# Patient Record
Sex: Male | Born: 2000 | Race: Black or African American | Hispanic: No | Marital: Single | Smoking: Never smoker
Health system: Southern US, Community
[De-identification: ages and names within clinical notes are randomized; demographics above are authoritative.]

## PROBLEM LIST (undated history)

## (undated) DIAGNOSIS — G4733 Obstructive sleep apnea (adult) (pediatric): Secondary | ICD-10-CM

## (undated) DIAGNOSIS — I10 Essential (primary) hypertension: Secondary | ICD-10-CM

## (undated) DIAGNOSIS — E8889 Other specified metabolic disorders: Secondary | ICD-10-CM

## (undated) DIAGNOSIS — D571 Sickle-cell disease without crisis: Secondary | ICD-10-CM

## (undated) DIAGNOSIS — J4520 Mild intermittent asthma, uncomplicated: Secondary | ICD-10-CM

## (undated) DIAGNOSIS — D5744 Sickle-cell thalassemia beta plus without crisis: Secondary | ICD-10-CM

## (undated) DIAGNOSIS — G43009 Migraine without aura, not intractable, without status migrainosus: Secondary | ICD-10-CM

## (undated) DIAGNOSIS — D75A Glucose-6-phosphate dehydrogenase (G6PD) deficiency without anemia: Secondary | ICD-10-CM

## (undated) DIAGNOSIS — I429 Cardiomyopathy, unspecified: Secondary | ICD-10-CM

## (undated) DIAGNOSIS — F411 Generalized anxiety disorder: Secondary | ICD-10-CM

## (undated) DIAGNOSIS — F909 Attention-deficit hyperactivity disorder, unspecified type: Secondary | ICD-10-CM

## (undated) HISTORY — DX: Obstructive sleep apnea (adult) (pediatric): G47.33

## (undated) HISTORY — DX: Essential (primary) hypertension: I10

## (undated) HISTORY — DX: Mild intermittent asthma, uncomplicated: J45.20

## (undated) HISTORY — DX: Generalized anxiety disorder: F41.1

## (undated) HISTORY — DX: Cardiomyopathy, unspecified: I42.9

## (undated) HISTORY — PX: WISDOM TOOTH EXTRACTION: SHX21

## (undated) HISTORY — DX: Migraine without aura, not intractable, without status migrainosus: G43.009

## (undated) HISTORY — DX: Attention-deficit hyperactivity disorder, unspecified type: F90.9

---
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
## 2020-03-20 ENCOUNTER — Encounter: Payer: Self-pay | Admitting: Family Medicine

## 2020-03-20 NOTE — Progress Notes (Unsigned)
Billy Rodgers is an 19 year old male with a medical history significant for sickle cell disease called with complaints of generalized pain that is consistent with his previous sickle cell pain crisis.  He was referred to clinic by Virgina Organ, [facility]

## 2020-03-21 ENCOUNTER — Non-Acute Institutional Stay (HOSPITAL_COMMUNITY): Payer: BLUE CROSS/BLUE SHIELD

## 2020-03-21 ENCOUNTER — Non-Acute Institutional Stay (HOSPITAL_COMMUNITY)
Admission: AD | Admit: 2020-03-21 | Discharge: 2020-03-21 | Disposition: A | Discharge status: Home / Self Care | Payer: BLUE CROSS/BLUE SHIELD | Source: Ambulatory Visit | Attending: Internal Medicine | Admitting: Internal Medicine

## 2020-03-21 DIAGNOSIS — E669 Obesity, unspecified: Secondary | ICD-10-CM | POA: Insufficient documentation

## 2020-03-21 DIAGNOSIS — D57459 Sickle-cell thalassemia beta plus with crisis, unspecified: Secondary | ICD-10-CM

## 2020-03-21 DIAGNOSIS — D5744 Sickle-cell thalassemia beta plus without crisis: Secondary | ICD-10-CM | POA: Diagnosis present

## 2020-03-21 DIAGNOSIS — D5700 Hb-SS disease with crisis, unspecified: Secondary | ICD-10-CM | POA: Diagnosis present

## 2020-03-21 DIAGNOSIS — Z885 Allergy status to narcotic agent status: Secondary | ICD-10-CM | POA: Insufficient documentation

## 2020-03-21 LAB — CBC WITH DIFFERENTIAL/PLATELET
Abs Immature Granulocytes: 0.04 10*3/uL (ref 0.00–0.07)
Basophils Absolute: 0 10*3/uL (ref 0.0–0.1)
Basophils Relative: 1 %
Eosinophils Absolute: 0.1 10*3/uL (ref 0.0–0.5)
Eosinophils Relative: 1 %
HCT: 37.1 % — ABNORMAL LOW (ref 39.0–52.0)
Hemoglobin: 11.6 g/dL — ABNORMAL LOW (ref 13.0–17.0)
Immature Granulocytes: 1 %
Lymphocytes Relative: 33 %
Lymphs Abs: 1.7 10*3/uL (ref 0.7–4.0)
MCH: 26.3 pg (ref 26.0–34.0)
MCHC: 31.3 g/dL (ref 30.0–36.0)
MCV: 84.1 fL (ref 80.0–100.0)
Monocytes Absolute: 0.6 10*3/uL (ref 0.1–1.0)
Monocytes Relative: 12 %
Neutro Abs: 2.7 10*3/uL (ref 1.7–7.7)
Neutrophils Relative %: 52 %
Platelets: 223 10*3/uL (ref 150–400)
RBC: 4.41 MIL/uL (ref 4.22–5.81)
RDW: 18.1 % — ABNORMAL HIGH (ref 11.5–15.5)
WBC: 5.1 10*3/uL (ref 4.0–10.5)
nRBC: 0 % (ref 0.0–0.2)

## 2020-03-21 LAB — COMPREHENSIVE METABOLIC PANEL
ALT: 23 U/L (ref 0–44)
AST: 28 U/L (ref 15–41)
Albumin: 4.4 g/dL (ref 3.5–5.0)
Alkaline Phosphatase: 72 U/L (ref 38–126)
Anion gap: 9 (ref 5–15)
BUN: 11 mg/dL (ref 6–20)
CO2: 24 mmol/L (ref 22–32)
Calcium: 8.6 mg/dL — ABNORMAL LOW (ref 8.9–10.3)
Chloride: 108 mmol/L (ref 98–111)
Creatinine, Ser: 0.89 mg/dL (ref 0.61–1.24)
GFR calc Af Amer: >60 mL/min (ref >60)
GFR calc non Af Amer: >60 mL/min (ref >60)
Glucose, Bld: 98 mg/dL (ref 70–99)
Potassium: 3.8 mmol/L (ref 3.5–5.1)
Sodium: 141 mmol/L (ref 135–145)
Total Bilirubin: 2.1 mg/dL — ABNORMAL HIGH (ref 0.3–1.2)
Total Protein: 6.6 g/dL (ref 6.5–8.1)

## 2020-03-21 LAB — RETICULOCYTES
Immature Retic Fract: 35.5 % — ABNORMAL HIGH (ref 2.3–15.9)
RBC.: 4.42 MIL/uL (ref 4.22–5.81)
Retic Count, Absolute: 178.1 10*3/uL (ref 19.0–186.0)
Retic Ct Pct: 4 % — ABNORMAL HIGH (ref 0.4–3.1)

## 2020-03-21 LAB — URINALYSIS, ROUTINE W REFLEX MICROSCOPIC
Bilirubin Urine: NEGATIVE
Glucose, UA: NEGATIVE mg/dL
Hgb urine dipstick: NEGATIVE
Ketones, ur: NEGATIVE mg/dL
Leukocytes,Ua: NEGATIVE
Nitrite: NEGATIVE
Protein, ur: NEGATIVE mg/dL
Specific Gravity, Urine: 1.012 (ref 1.005–1.030)
pH: 6 (ref 5.0–8.0)

## 2020-03-21 LAB — RAPID URINE DRUG SCREEN, HOSP PERFORMED
Amphetamines: NOT DETECTED
Barbiturates: NOT DETECTED
Benzodiazepines: NOT DETECTED
Cocaine: NOT DETECTED
Opiates: POSITIVE — AB
Tetrahydrocannabinol: NOT DETECTED

## 2020-03-21 LAB — LACTATE DEHYDROGENASE: LDH: 183 U/L (ref 98–192)

## 2020-03-21 MED ORDER — HYDROMORPHONE HCL 1 MG/ML IJ SOLN
1.0000 mg | Freq: Once | INTRAMUSCULAR | Status: AC
  Administered 2020-03-21: 1 mg via INTRAVENOUS
  Filled 2020-03-21: qty 1

## 2020-03-21 MED ORDER — ACETAMINOPHEN 500 MG PO TABS
1000.0000 mg | ORAL_TABLET | Freq: Once | ORAL | Status: AC
  Administered 2020-03-21: 1000 mg via ORAL
  Filled 2020-03-21: qty 2

## 2020-03-21 MED ORDER — SODIUM CHLORIDE 0.45 % IV SOLN: INTRAVENOUS | Status: DC

## 2020-03-21 MED ORDER — DIPHENHYDRAMINE HCL 25 MG PO CAPS: 25.0000 mg | ORAL_CAPSULE | ORAL | Status: DC | PRN

## 2020-03-21 MED ORDER — ONDANSETRON HCL 4 MG/2ML IJ SOLN: 4.0000 mg | Freq: Four times a day (QID) | INTRAMUSCULAR | Status: DC | PRN

## 2020-03-21 MED ORDER — KETOROLAC TROMETHAMINE 30 MG/ML IJ SOLN
15.0000 mg | Freq: Once | INTRAMUSCULAR | Status: AC
  Administered 2020-03-21: 15 mg via INTRAVENOUS
  Filled 2020-03-21: qty 1

## 2020-03-21 MED ORDER — OXYCODONE HCL 5 MG PO TABS
5.0000 mg | ORAL_TABLET | Freq: Once | ORAL | Status: AC
  Administered 2020-03-21: 5 mg via ORAL
  Filled 2020-03-21: qty 1

## 2020-03-21 NOTE — Discharge Summary (Signed)
[facility] Discharge Summary   Patient ID: Billy Rodgers MRN: [id number] DOB/AGE: 2001-01-08 18 y.o.  Admit date: 03/21/2020 Discharge date: 03/21/2020  Primary Care Physician:  Patient, No Pcp Per  Admission Diagnoses:  Principal Problem:   Sickle cell disease, type S beta-plus thalassemia with crisis Active Problems:   Sickle cell pain crisis [facility])   Discharge Medications:  Allergies as of 03/21/2020      Reactions   Demerol [meperidine] Other (See Comments)   seizure      Medication List    You have not been prescribed any medications.      Consults:  None  Significant Diagnostic Studies:  No results found.  History of present illness: Billy Rodgers is an 19 year old male with a medical history significant for sickle cell disease, type S beta plus thalassemia presents with complaints of left upper extremity and right lower extremity pain that has been unrelieved by home pain medications.  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  Patient was in his usual state of health prior to 3 days ago when pain intensity suddenly increased.  He has not identified any provocative factors concerning current crisis.  His primary care provider is in [place] and he recently established care with hematologist at [facility] system.  Patient has been taking hydroxyurea and recently started monthly Crizanlizamab.  He typically has sickle cell crisis around every 2 to 3 months.  His current pain intensity is 7/10 primarily to right lower extremity and left upper extremity.  He last had hydrocodone this a.m. without sustained relief.  He denies any headache, chest pain, urinary symptoms, persistent cough, fever, chills, nausea, vomiting, or diarrhea.  He has had no sick contacts, recent travel, or exposure to COVID-19. [facility] Course:  Patient admitted to sickle cell day infusion clinic for management of pain  crisis. Reviewed all laboratory values.  Hemoglobin 11.6, appears to be consistent with patient's baseline.  Total bilirubin is slightly elevated at 2.1.  LDH is within normal limits.  Pain managed with IV Dilaudid 1 mg x 1 IV fluids, 0.45% saline at 150 mL/h Toradol 15 mg IV x1 Tylenol 1000 mg x 1 Oxycodone 5 mg x 1 Pain intensity decreased to 2/10.  Patient does not warrant inpatient admission on today. He is alert, oriented, and ambulating without assistance.  Discussed the importance of establishing care with a PCP.  Also, follow-up with hematologist as scheduled.  Patient expressed understanding. Patient will discharge home in a hemodynamically stable condition.  Discharge instructions: Resume all home medications.   Follow up with PCP as previously  scheduled.   Discussed the importance of drinking 64 ounces of water daily, dehydration of red blood cells may lead further sickling.   Avoid all stressors that precipitate sickle cell pain crisis.     The patient was given clear instructions to go to ER or return to medical center if symptoms do not improve, worsen or new problems develop.    Physical Exam at Discharge:  BP (!) 151/73 (BP Location: Left Arm)   Pulse 70   Temp 98.5 F (36.9 C) (Oral)   Resp 16   SpO2 100%   Physical Exam Constitutional:      Appearance: Normal appearance. He is obese.  HENT:     Head: Normocephalic.     Mouth/Throat:     Mouth: Mucous membranes are moist.  Eyes:     Pupils: Pupils are  equal, round, and reactive to light.  Cardiovascular:     Rate and Rhythm: Normal rate and regular rhythm.     Pulses: Normal pulses.  Pulmonary:     Effort: Pulmonary effort is normal.  Abdominal:     General: Abdomen is flat. Bowel sounds are normal.  Musculoskeletal:        General: Normal range of motion.  Skin:    General: Skin is warm.  Neurological:     General: No focal deficit present.     Mental Status: He is alert. Mental status is at  baseline.  Psychiatric:        Mood and Affect: Mood normal.        Behavior: Behavior normal.        Thought Content: Thought content normal.        Judgment: Judgment normal.      Disposition at Discharge:   Discharge Orders:   Condition at Discharge:   Stable  Time spent on Discharge:  Greater than 30 minutes.  Signed; Nolon Nations  APRN, MSN, FNP-C Patient Care [facility] [address] [place], [place] [address] [phone]  03/21/2020, 12:08 PM

## 2020-03-21 NOTE — Progress Notes (Signed)
Patient admitted to the day hospital for treatment of sickle cell pain crisis. Patient reported pain rated 7/10 in the left leg and 3/10 in the right arm. Patient given IV Dilaudid, PO Oxycodone, IV Toradol, PO Tylenol and hydrated with IV fluids. At discharge patient reported  pain at 2/10 in the leg and 0/10 in the arm. Discharge instructions given. Patient alert, oriented and ambulatory at discharge.

## 2020-03-21 NOTE — H&P (Signed)
[facility] History and Physical   Date: 03/21/2020  Patient name: Billy Rodgers Medical record number: [id number] Date of birth: [birth year] Age: 19 y.o. Gender: male PCP: Patient, No Pcp Per  Attending physician: Quentin Angst, MD  Chief Complaint: Sickle cell pain   History of Present Illness: Kian Ottaviano is an 19 year old male with a medical history significant for sickle cell disease, type S beta plus thalassemia presents with complaints of left upper extremity and right lower extremity pain that has been unrelieved by home pain medications.  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  Patient was in his usual state of health prior to 3 days ago when pain intensity suddenly increased.  He has not identified any provocative factors concerning current crisis.  His primary care provider is in [place] and he recently established care with hematologist at [facility] system.  Patient has been taking hydroxyurea and recently started monthly Crizanlizamab.  He typically has sickle cell crisis around every 2 to 3 months.  His current pain intensity is 7/10 primarily to right lower extremity and left upper extremity.  He last had hydrocodone this a.m. without sustained relief.  He denies any headache, chest pain, urinary symptoms, persistent cough, fever, chills, nausea, vomiting, or diarrhea.  He has had no sick contacts, recent travel, or exposure to COVID-19.  Meds: No medications prior to admission.    Allergies: Demerol [meperidine] Past Medical History:  Diagnosis Date  . CYP2C19 intermediate metabolizer (HCC)   . G6PD deficiency   . Sickle cell anemia (HCC)   . Sickle cell disease, type S beta-plus thalassemia    No past surgical history on file. No family history on file. Social History   Socioeconomic History  . Marital status: Single    Spouse name: Not on file  . Number of children: Not on file  . Years  of education: Not on file  . Highest education level: Not on file  Occupational History  . Not on file  Tobacco Use  . Smoking status: Never Smoker  . Smokeless tobacco: Never Used  Substance and Sexual Activity  . Alcohol use: Not on file  . Drug use: Not on file  . Sexual activity: Not on file  Other Topics Concern  . Not on file  Social History Narrative  . Not on file   Social Determinants of Health   Financial Resource Strain:   . Difficulty of Paying Living Expenses: Not on file  Food Insecurity:   . Worried About Programme researcher, broadcasting/film/video in the Last Year: Not on file  . Ran Out of Food in the Last Year: Not on file  Transportation Needs:   . Lack of Transportation (Medical): Not on file  . Lack of Transportation (Non-Medical): Not on file  Physical Activity:   . Days of Exercise per Week: Not on file  . Minutes of Exercise per Session: Not on file  Stress:   . Feeling of Stress : Not on file  Social Connections:   . Frequency of Communication with Friends and Family: Not on file  . Frequency of Social Gatherings with Friends and Family: Not on file  . Attends Religious Services: Not on file  . Active Member of Clubs or Organizations: Not on file  . Attends Banker Meetings: Not on file  . Marital Status: Not on file  Intimate Partner Violence:   . Fear of Current or Ex-Partner: Not  on file  . Emotionally Abused: Not on file  . Physically Abused: Not on file  . Sexually Abused: Not on file   Review of Systems  Constitutional: Negative for chills and fever.  HENT: Negative.   Eyes: Negative.   Respiratory: Negative.   Cardiovascular: Negative.   Gastrointestinal: Negative.   Genitourinary: Negative.   Musculoskeletal: Positive for joint pain. Negative for back pain.  Skin: Negative.   Neurological: Negative.   Psychiatric/Behavioral: Negative.      Physical Exam: Blood pressure (!) 151/73, pulse 70, temperature 98.5 F (36.9 C), temperature  source Oral, resp. rate 16, SpO2 100 %. Physical Exam Constitutional:      Appearance: Normal appearance. He is obese.  Eyes:     Pupils: Pupils are equal, round, and reactive to light.  Cardiovascular:     Rate and Rhythm: Normal rate and regular rhythm.     Pulses: Normal pulses.  Pulmonary:     Effort: Pulmonary effort is normal.  Abdominal:     General: Abdomen is flat. Bowel sounds are normal.  Musculoskeletal:        General: Normal range of motion.  Skin:    General: Skin is warm.  Neurological:     General: No focal deficit present.     Mental Status: He is alert. Mental status is at baseline.  Psychiatric:        Mood and Affect: Mood normal.        Thought Content: Thought content normal.        Judgment: Judgment normal.      Lab results: Results for orders placed or performed during the hospital encounter of 03/21/20 (from the past 24 hour(s))  CBC with Differential/Platelet     Status: Abnormal   Collection Time: 03/21/20  9:10 AM  Result Value Ref Range   WBC 5.1 4.0 - 10.5 K/uL   RBC 4.41 4.22 - 5.81 MIL/uL   Hemoglobin 11.6 (L) 13.0 - 17.0 g/dL   HCT 40.9 (L) 39 - 52 %   MCV 84.1 80.0 - 100.0 fL   MCH 26.3 26.0 - 34.0 pg   MCHC 31.3 30.0 - 36.0 g/dL   RDW 81.1 (H) 91.4 - 78.2 %   Platelets 223 150 - 400 K/uL   nRBC 0.0 0.0 - 0.2 %   Neutrophils Relative % 52 %   Neutro Abs 2.7 1.7 - 7.7 K/uL   Lymphocytes Relative 33 %   Lymphs Abs 1.7 0.7 - 4.0 K/uL   Monocytes Relative 12 %   Monocytes Absolute 0.6 0 - 1 K/uL   Eosinophils Relative 1 %   Eosinophils Absolute 0.1 0 - 0 K/uL   Basophils Relative 1 %   Basophils Absolute 0.0 0 - 0 K/uL   Immature Granulocytes 1 %   Abs Immature Granulocytes 0.04 0.00 - 0.07 K/uL  Comprehensive metabolic panel     Status: Abnormal   Collection Time: 03/21/20  9:10 AM  Result Value Ref Range   Sodium 141 135 - 145 mmol/L   Potassium 3.8 3.5 - 5.1 mmol/L   Chloride 108 98 - 111 mmol/L   CO2 24 22 - 32 mmol/L    Glucose, Bld 98 70 - 99 mg/dL   BUN 11 6 - 20 mg/dL   Creatinine, Ser 9.56 0.61 - 1.24 mg/dL   Calcium 8.6 (L) 8.9 - 10.3 mg/dL   Total Protein 6.6 6.5 - 8.1 g/dL   Albumin 4.4 3.5 - 5.0 g/dL   AST  28 15 - 41 U/L   ALT 23 0 - 44 U/L   Alkaline Phosphatase 72 38 - 126 U/L   Total Bilirubin 2.1 (H) 0.3 - 1.2 mg/dL   GFR calc non Af Amer >60 >60 mL/min   GFR calc Af Amer >60 >60 mL/min   Anion gap 9 5 - 15  Reticulocytes     Status: Abnormal   Collection Time: 03/21/20  9:10 AM  Result Value Ref Range   Retic Ct Pct 4.0 (H) 0.4 - 3.1 %   RBC. 4.42 4.22 - 5.81 MIL/uL   Retic Count, Absolute 178.1 19.0 - 186.0 K/uL   Immature Retic Fract 35.5 (H) 2.3 - 15.9 %  Lactate dehydrogenase     Status: None   Collection Time: 03/21/20  9:10 AM  Result Value Ref Range   LDH 183 98 - 192 U/L  Urinalysis, Routine w reflex microscopic     Status: None   Collection Time: 03/21/20 10:20 AM  Result Value Ref Range   Color, Urine YELLOW YELLOW   APPearance CLEAR CLEAR   Specific Gravity, Urine 1.012 1.005 - 1.030   pH 6.0 5.0 - 8.0   Glucose, UA NEGATIVE NEGATIVE mg/dL   Hgb urine dipstick NEGATIVE NEGATIVE   Bilirubin Urine NEGATIVE NEGATIVE   Ketones, ur NEGATIVE NEGATIVE mg/dL   Protein, ur NEGATIVE NEGATIVE mg/dL   Nitrite NEGATIVE NEGATIVE   Leukocytes,Ua NEGATIVE NEGATIVE  Rapid urine drug screen (hospital performed)     Status: Abnormal   Collection Time: 03/21/20 10:20 AM  Result Value Ref Range   Opiates POSITIVE (A) NONE DETECTED   Cocaine NONE DETECTED NONE DETECTED   Benzodiazepines NONE DETECTED NONE DETECTED   Amphetamines NONE DETECTED NONE DETECTED   Tetrahydrocannabinol NONE DETECTED NONE DETECTED   Barbiturates NONE DETECTED NONE DETECTED    Imaging results:  No results found.   Assessment & Plan: Patient admitted to sickle cell day infusion center for management of pain crisis. He is opiate nave. Pain managed with IV Dilaudid 1 mg every 2 hours as needed IV  fluids, 0.45% saline at 150 mL/h Toradol 15 mg IV x1 Tylenol 1000 mg by mouth x1 Benadryl 25 mg every 4 hours as needed for itching Zofran 4 mg IV every 6 hours as needed for nausea/vomiting Review CBC with differential, CMP, and reticulocytes as results become available. Pain will be reevaluated in context of function and relationship to baseline as care progresses If pain intensity remains elevated, and/or hemodynamic stability changes.  Consider transitioning to inpatient services for higher level of care.   Nolon Nations  APRN, MSN, FNP-C Patient Care [facility] [address] [place], [place] [address] [phone]  03/21/2020, 11:55 AM

## 2020-03-21 NOTE — Discharge Instructions (Signed)
The number here at sickle cell day infusion clinic is [phone].  The clinic is open Monday through Friday from 8 AM to 4:30 PM.  Patient intake is from 8 until 12.   Sickle Cell Anemia, Adult  Sickle cell anemia is a condition where your red blood cells are shaped like sickles. Red blood cells carry oxygen through the body. Sickle-shaped cells do not live as long as normal red blood cells. They also clump together and block blood from flowing through the blood vessels. This prevents the body from getting enough oxygen. Sickle cell anemia causes organ damage and pain. It also increases the risk of infection. Follow these instructions at home: Medicines  Take over-the-counter and prescription medicines only as told by your doctor.  If you were prescribed an antibiotic medicine, take it as told by your doctor. Do not stop taking the antibiotic even if you start to feel better.  If you develop a fever, do not take medicines to lower the fever right away. Tell your doctor about the fever. Managing pain, stiffness, and swelling  Try these methods to help with pain: ? Use a heating pad. ? Take a warm bath. ? Distract yourself, such as by watching TV. Eating and drinking  Drink enough fluid to keep your pee (urine) clear or pale yellow. Drink more in hot weather and during exercise.  Limit or avoid alcohol.  Eat a healthy diet. Eat plenty of fruits, vegetables, whole grains, and lean protein.  Take vitamins and supplements as told by your doctor. Traveling  When traveling, keep these with you: ? Your medical information. ? The names of your doctors. ? Your medicines.  If you need to take an airplane, talk to your doctor first. Activity  Rest often.  Avoid exercises that make your heart beat much faster, such as jogging. General instructions  Do not use products that have nicotine or tobacco, such as cigarettes and e-cigarettes. If you need help quitting, ask your  doctor.  Consider wearing a medical alert bracelet.  Avoid being in high places (high altitudes), such as mountains.  Avoid very hot or cold temperatures.  Avoid places where the temperature changes a lot.  Keep all follow-up visits as told by your doctor. This is important. Contact a doctor if:  A joint hurts.  Your feet or hands hurt or swell.  You feel tired (fatigued). Get help right away if:  You have symptoms of infection. These include: ? Fever. ? Chills. ? Being very tired. ? Irritability. ? Poor eating. ? Throwing up (vomiting).  You feel dizzy or faint.  You have new stomach pain, especially on the left side.  You have a an erection (priapism) that lasts more than 4 hours.  You have numbness in your arms or legs.  You have a hard time moving your arms or legs.  You have trouble talking.  You have pain that does not go away when you take medicine.  You are short of breath.  You are breathing fast.  You have a long-term cough.  You have pain in your chest.  You have a bad headache.  You have a stiff neck.  Your stomach looks bloated even though you did not eat much.  Your skin is pale.  You suddenly cannot see well. Summary  Sickle cell anemia is a condition where your red blood cells are shaped like sickles.  Follow your doctor's advice on ways to manage pain, food to eat, activities to do, and  steps to take for safe travel.  Get medical help right away if you have any signs of infection, such as a fever. This information is not intended to replace advice given to you by your health care provider. Make sure you discuss any questions you have with your health care provider. Document Revised: 10/08/2018 Document Reviewed: 07/22/2016 Elsevier Patient Education  2020 [employer].

## 2020-04-19 NOTE — Progress Notes (Signed)
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 A urine drug screen was performed to determine previous opiate use and any illicit substances that may have been present, which is valuable information.  Urine drug screen was reviewed. No illicit substances detected.  Patient was thereby treated with IV opiates, IV fluids and IV Toradol. Pain intensity improved throughout admission. Patient did not warrant transition to inpatient services. He was discharged home in a hemodynamically stable condition.   Nolon Nations  APRN, MSN, FNP-C Patient Care [facility] [address] [place], [place] [address] [phone]

## 2021-04-18 IMAGING — CT CT ANGIO CHEST
2 of 12 series · 13 of 46 positions shown · IV contrast (APPLIED)
Comparison: None.

CLINICAL DATA: History of sickle cell disease with chest pain and
left-sided abdominal pain.

EXAM:
CT ANGIOGRAPHY CHEST
CT ABDOMEN AND PELVIS WITH CONTRAST
TECHNIQUE: Multidetector CT imaging of the chest was performed using the
standard protocol during bolus administration of intravenous
contrast. Multiplanar CT image reconstructions and MIPs were
obtained to evaluate the vascular anatomy. Multidetector CT imaging
of the abdomen and pelvis was performed using the standard protocol
during bolus administration of intravenous contrast.
CONTRAST:  100mL OMNIPAQUE IOHEXOL 350 MG/ML SOLN

[Series 3: thins · axial · 0.69mm/px · z∈[-700,-481]mm · 12 of 259 slices shown]
[im 20/259  lung]
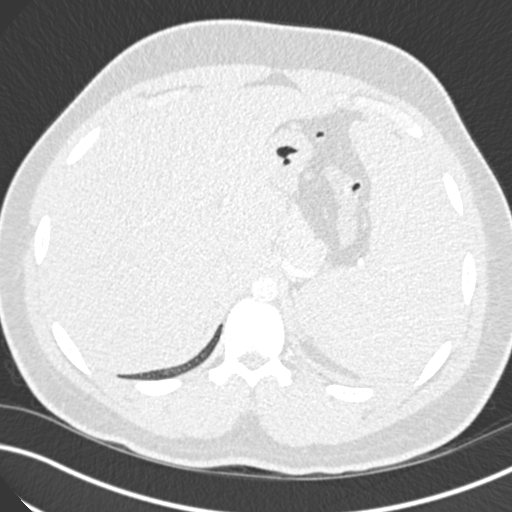
[im 40/259  soft-tissue]
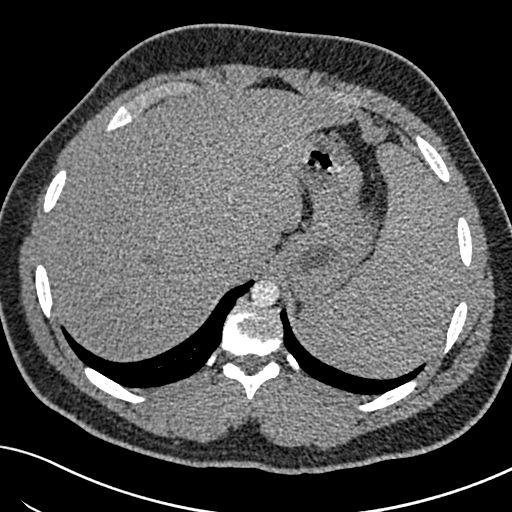
[im 60/259  lung]
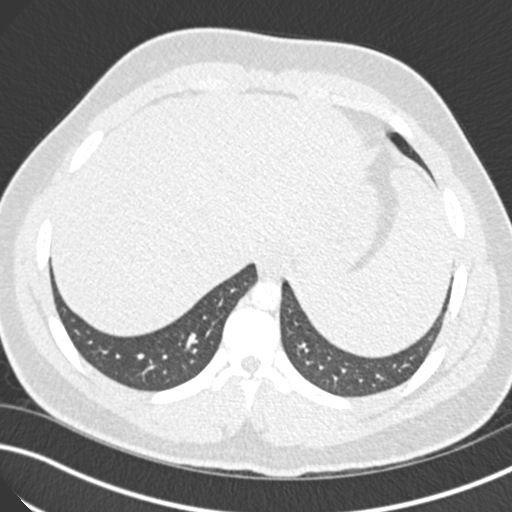
[im 80/259  soft-tissue]
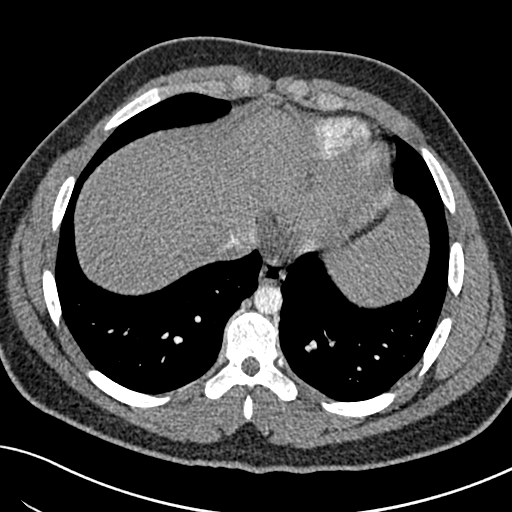
[im 100/259  lung]
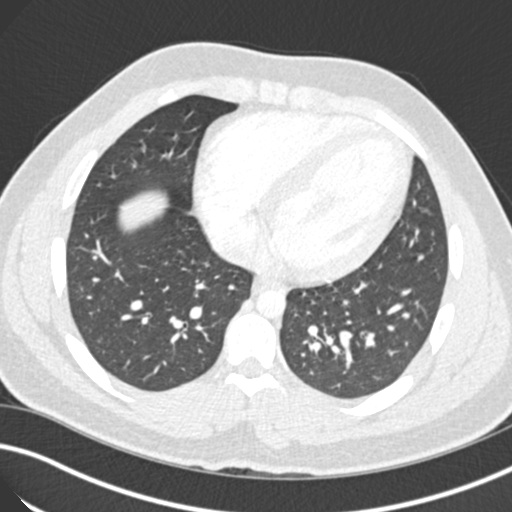
[im 120/259  soft-tissue]
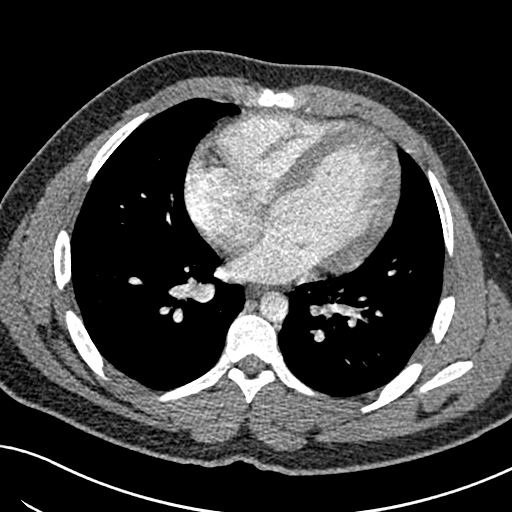
[im 139/259  lung]
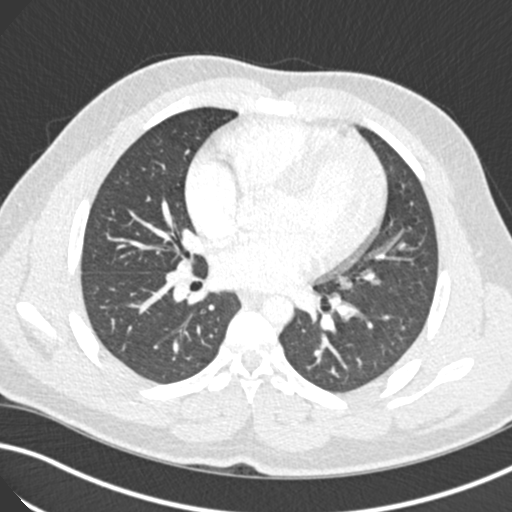
[im 159/259  soft-tissue]
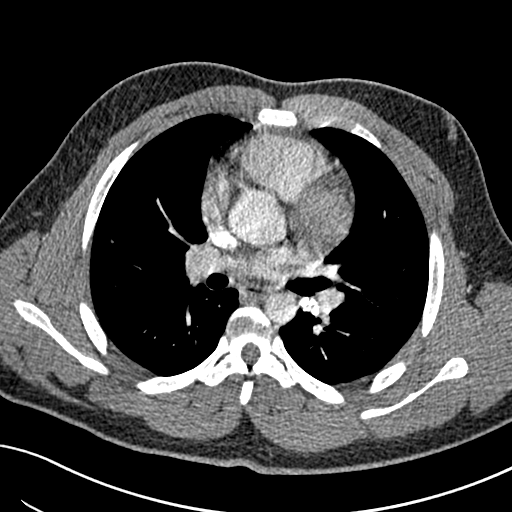
[im 179/259  lung]
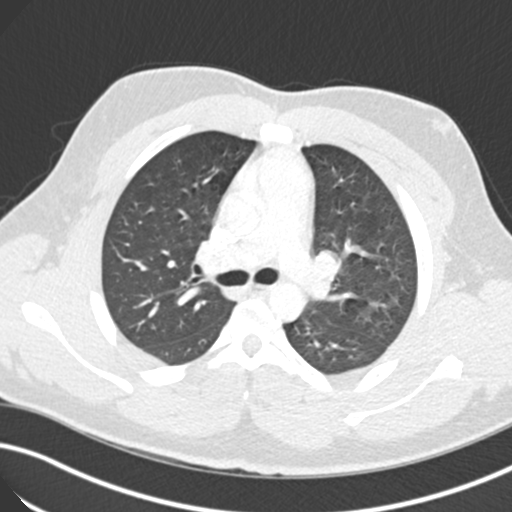
[im 199/259  soft-tissue]
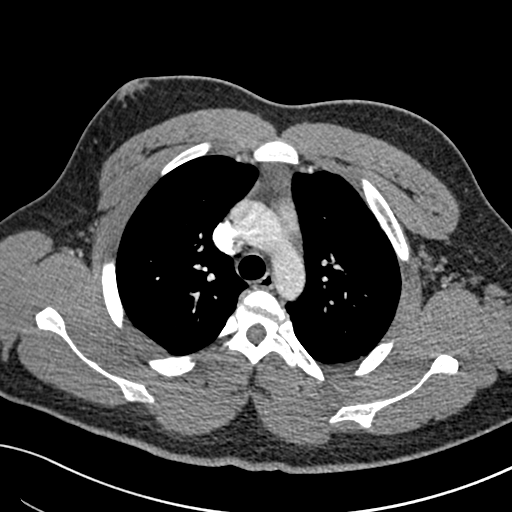
[im 219/259  lung]
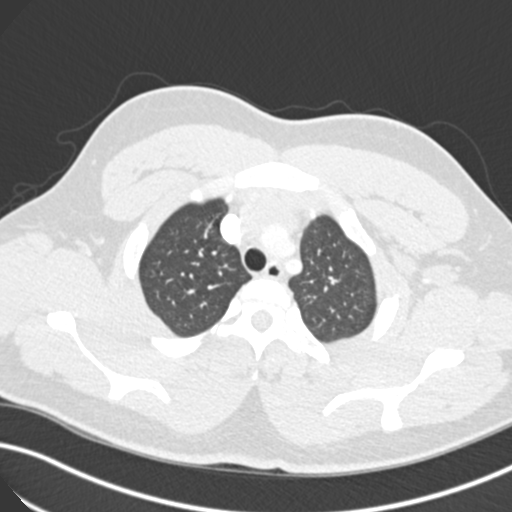
[im 239/259  soft-tissue]
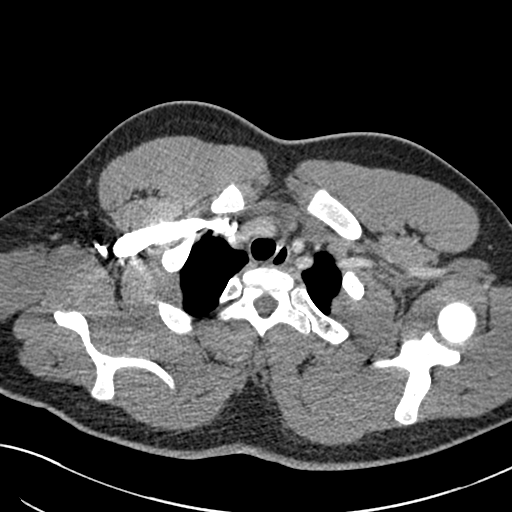

[Series 5: coronal mpr · coronal · 0.52mm/px · 1 of 123 slices shown]
[im 62/123  soft-tissue]
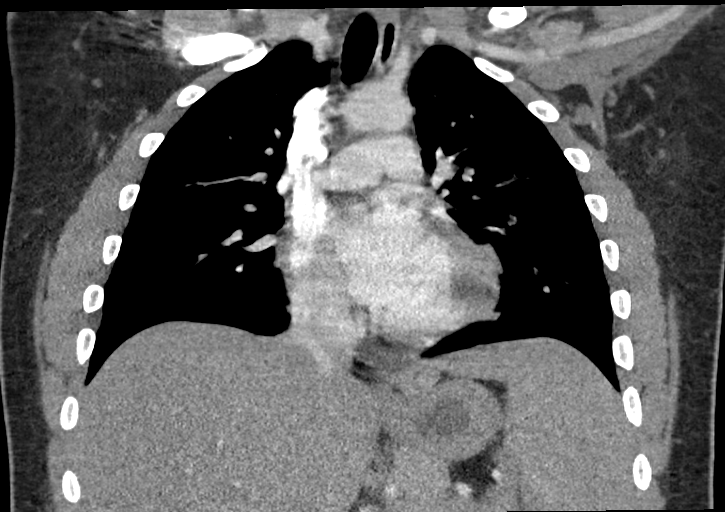

[13 of 46 positions shown; findings below may reference images not displayed]

FINDINGS: CTA CHEST FINDINGS

Cardiovascular: Satisfactory opacification of the pulmonary arteries
to the segmental level. No evidence of pulmonary embolism. Normal
heart size. No pericardial effusion.

Mediastinum/Nodes: No enlarged mediastinal, hilar, or axillary lymph
nodes. Thyroid gland, trachea, and esophagus demonstrate no
significant findings.

Lungs/Pleura: Lungs are clear. No pleural effusion or pneumothorax.

Musculoskeletal: No chest wall abnormality. No acute or significant
osseous findings.

Review of the MIP images confirms the above findings.

CT ABDOMEN and PELVIS FINDINGS

Hepatobiliary: No focal liver abnormality is seen. No gallstones,
gallbladder wall thickening, or biliary dilatation.

Pancreas: Unremarkable. No pancreatic ductal dilatation or
surrounding inflammatory changes.

Spleen: There is marked severity splenomegaly. Areas of
heterogeneous low attenuation are seen throughout the periphery of
the mid and lower spleen. No perisplenic fluid is identified.

Adrenals/Urinary Tract: Adrenal glands are unremarkable. Kidneys are
normal, without renal calculi, focal lesion, or hydronephrosis.
Bladder is unremarkable.

Stomach/Bowel: Stomach is within normal limits. Appendix appears
normal. No evidence of bowel wall thickening, distention, or
inflammatory changes.

Vascular/Lymphatic: No significant vascular findings are present. No
enlarged abdominal or pelvic lymph nodes.

Reproductive: Prostate is unremarkable.

Other: No abdominal wall hernia or abnormality. No abdominopelvic
ascites.

Musculoskeletal: No acute or significant osseous findings.

Review of the MIP images confirms the above findings.
IMPRESSION: 1. No evidence for pulmonary embolus.
2. Marked enlarged spleen which contains areas that likely represent
splenic infarcts.

## 2021-10-25 DIAGNOSIS — F909 Attention-deficit hyperactivity disorder, unspecified type: Secondary | ICD-10-CM | POA: Insufficient documentation

## 2021-10-25 DIAGNOSIS — G43009 Migraine without aura, not intractable, without status migrainosus: Secondary | ICD-10-CM | POA: Insufficient documentation

## 2021-10-25 DIAGNOSIS — J4520 Mild intermittent asthma, uncomplicated: Secondary | ICD-10-CM | POA: Insufficient documentation

## 2021-10-25 DIAGNOSIS — D75A Glucose-6-phosphate dehydrogenase (G6PD) deficiency without anemia: Secondary | ICD-10-CM | POA: Insufficient documentation

## 2021-10-25 DIAGNOSIS — I519 Heart disease, unspecified: Secondary | ICD-10-CM | POA: Insufficient documentation

## 2021-10-25 DIAGNOSIS — I10 Essential (primary) hypertension: Secondary | ICD-10-CM | POA: Insufficient documentation

## 2021-10-25 DIAGNOSIS — F411 Generalized anxiety disorder: Secondary | ICD-10-CM | POA: Insufficient documentation

## 2021-12-03 DIAGNOSIS — G4733 Obstructive sleep apnea (adult) (pediatric): Secondary | ICD-10-CM | POA: Insufficient documentation

## 2021-12-03 DIAGNOSIS — I429 Cardiomyopathy, unspecified: Secondary | ICD-10-CM | POA: Insufficient documentation

## 2022-02-27 ENCOUNTER — Ambulatory Visit (INDEPENDENT_AMBULATORY_CARE_PROVIDER_SITE_OTHER): Payer: BC Managed Care – PPO | Admitting: Oncology

## 2022-02-27 ENCOUNTER — Encounter: Payer: Self-pay | Admitting: Oncology

## 2022-02-27 ENCOUNTER — Other Ambulatory Visit: Payer: Self-pay

## 2022-02-27 VITALS — HR 84 | Temp 97.8°F | Resp 18 | Ht 71.0 in | Wt 274.0 lb

## 2022-02-27 DIAGNOSIS — J45909 Unspecified asthma, uncomplicated: Secondary | ICD-10-CM | POA: Diagnosis not present

## 2022-02-27 DIAGNOSIS — J029 Acute pharyngitis, unspecified: Secondary | ICD-10-CM

## 2022-02-27 DIAGNOSIS — R0602 Shortness of breath: Secondary | ICD-10-CM | POA: Diagnosis not present

## 2022-02-27 MED ORDER — ALBUTEROL SULFATE HFA 108 (90 BASE) MCG/ACT IN AERS: 2.0000 | INHALATION_SPRAY | Freq: Four times a day (QID) | RESPIRATORY_TRACT | 2 refills | Status: AC | PRN

## 2022-02-27 MED ORDER — FLUTICASONE PROPIONATE 50 MCG/ACT NA SUSP: 2.0000 | Freq: Every day | NASAL | 6 refills | Status: AC

## 2022-02-27 NOTE — Progress Notes (Signed)
[facility] [address] [place], [place] [address] Phone: [phone] Fax: [phone]   Office Visit Note  Patient Name: Billy Rodgers  Date of Birth:Apr 15, 2001  Med Rec number [id number]  Date of Service: 02/27/2022  Demerol [meperidine]  Chief Complaint  Patient presents with   Cough   Here for complaints of nausea, sob, cough, sneezing, sore throat, congestion without fever that started Sunday. Sob is a problem with or without activity. Cough and congestion is worse in the morning. Roommate was exposed to Covid 2 days ago and he was exposed to a professor that was "getting over Covid". Tried cold and flu medication yesterday morning at 1am.  Takes Zyrtec daily.   Has hx of asthma. Has an inhaler that he has not been using recently. Had asthma attack in Feb and used it last them. Typically gets asthma exacerbation with exercise.   Current Medication:  Outpatient Encounter Medications as of 02/27/2022  Medication Sig   albuterol (VENTOLIN HFA) 108 (90 Base) MCG/ACT inhaler Inhale 2 puffs into the lungs every 6 (six) hours as needed for wheezing or shortness of breath.   Cholecalciferol (VITAMIN D-1000 MAX ST) 25 MCG (1000 UT) tablet Take by mouth.   Cholecalciferol 50 MCG (2000 UT) CAPS Take by mouth.   EPINEPHrine 0.3 mg/0.3 mL IJ SOAJ injection    fluticasone (FLONASE) 50 MCG/ACT nasal spray Place into the nose.   fluticasone (FLONASE) 50 MCG/ACT nasal spray Place 2 sprays into both nostrils daily.   gabapentin (NEURONTIN) 100 MG capsule Take by mouth.   lamoTRIgine (LAMICTAL) 25 MG tablet 1 BY MOUTH EVERY MORNING FOR 1 WEEK THEN 2 BY MOUTH EVERY MORNING   methylphenidate (RITALIN) 10 MG tablet TAKE 1 TABLET BY MOUTH EVERY DAY IN THE AFTERNOON   montelukast (SINGULAIR) 10 MG tablet Take by mouth.   ondansetron (ZOFRAN-ODT) 4 MG disintegrating tablet Take by mouth.   sacubitril-valsartan (ENTRESTO) 24-26 MG Take 1 tablet by mouth 2 (two) times daily.   spironolactone  (ALDACTONE) 25 MG tablet Take 1 tablet by mouth daily.   topiramate (TOPAMAX) 100 MG tablet Take by mouth.   Ubrogepant (UBRELVY) 100 MG TABS TAKE 1/2 TO 1 TABLET BY MOUTH AT ONSET OF HEADACHE. MAY REPEAT IN 2 HOURS IF NEEDED   ADAKVEO 100 MG/10ML SOLN Inject into the vein.   busPIRone (BUSPAR) 15 MG tablet Take 22.5 mg by mouth 2 (two) times daily.   cetirizine (ZYRTEC) 10 MG chewable tablet Chew by mouth.   escitalopram (LEXAPRO) 20 MG tablet Take 20 mg by mouth daily.   hydroxyurea (HYDREA) 500 MG capsule Take 2,000 mg by mouth daily.   JARDIANCE 10 MG TABS tablet Take 10 mg by mouth daily.   metoprolol tartrate (LOPRESSOR) 25 MG tablet Take 12.5 mg by mouth 2 (two) times daily.   SYMBICORT 160-4.5 MCG/ACT inhaler Inhale into the lungs.   No facility-administered encounter medications on file as of 02/27/2022.    Medical History: Past Medical History:  Diagnosis Date   CYP2C19 intermediate metabolizer (HCC)    G6PD deficiency    Sickle cell anemia (HCC)    Sickle cell disease, type S beta-plus thalassemia (HCC)      Vital Signs: Pulse 84   Temp 97.8 F (36.6 C) (Tympanic)   Resp 18   Ht 5\' 11"  (1.803 m)   Wt 274 lb (124.3 kg)   SpO2 99%   BMI 38.22 kg/m    Review of Systems  Constitutional:  Positive for fatigue.  HENT:  Positive for congestion, sneezing and sore throat.   Respiratory:  Positive for cough and shortness of breath.     Physical Exam HENT:     Right Ear: Tympanic membrane normal.     Left Ear: Tympanic membrane normal.     Ears:     Comments: Dull TM's Right > Left     Mouth/Throat:     Mouth: Mucous membranes are moist.     Pharynx: Oropharynx is clear. Posterior oropharyngeal erythema present.  Cardiovascular:     Rate and Rhythm: Normal rate.  Neurological:     Mental Status: He is alert.     No results found for this or any previous visit (from the past 24 hour(s)).  Assessment/Plan: 1. Sore throat - Clinically does not look like  strep appears viral; No tonsils.  - Covid Negative.  - Recommend trial otc medications. Discussed cold and flu medicine that he has at home verse picking up dayquil/nyquil, flonse and delsym for cough. -[place] garggles for sore throat. Can also try ibuprofen (3 tabs) every 8 hours for relief.   2. Shortness of breath - Exam not consistent with asthma exacerbation but given history would recommend consistent use of Symbicort and will add albuterol inhaler prn. States he normally has rescue inhaler but can't find it here at [facility].  -Recommend albuterol inhaler every 6 hours for the next 24-48 hours to help sob.  -Continue zyrtec and Singulair daily. Ran out of Flonase but recommend restarting 2 sprays twice daily.  - Use cold and flu daily per brand and packaging to help with congestion.   Disposition- RTC if symptoms do not improve or worsen.    General Counseling: theodoros stjames understanding of the findings of todays visit and agrees with plan of treatment. I have discussed any further diagnostic evaluation that may be needed or ordered today. We also reviewed his medications today. he has been encouraged to call the office with any questions or concerns that should arise related to todays visit.   No orders of the defined types were placed in this encounter.   Meds ordered this encounter  Medications   albuterol (VENTOLIN HFA) 108 (90 Base) MCG/ACT inhaler    Sig: Inhale 2 puffs into the lungs every 6 (six) hours as needed for wheezing or shortness of breath.    Dispense:  8 g    Refill:  2   fluticasone (FLONASE) 50 MCG/ACT nasal spray    Sig: Place 2 sprays into both nostrils daily.    Dispense:  16 g    Refill:  6    I spent 25 minutes dedicated to the care of this patient (face-to-face and non-face-to-face) on the date of the encounter to include what is described in the assessment and plan.   Durenda Hurt, NP 02/27/2022 11:43 AM

## 2022-03-21 ENCOUNTER — Ambulatory Visit (INDEPENDENT_AMBULATORY_CARE_PROVIDER_SITE_OTHER): Payer: BC Managed Care – PPO | Admitting: Medical

## 2022-03-21 ENCOUNTER — Encounter: Payer: Self-pay | Admitting: Medical

## 2022-03-21 ENCOUNTER — Other Ambulatory Visit: Payer: Self-pay

## 2022-03-21 VITALS — BP 152/92 | HR 74 | Temp 98.7°F | Wt 263.0 lb

## 2022-03-21 DIAGNOSIS — D571 Sickle-cell disease without crisis: Secondary | ICD-10-CM

## 2022-03-21 DIAGNOSIS — R208 Other disturbances of skin sensation: Secondary | ICD-10-CM

## 2022-03-21 DIAGNOSIS — D57459 Sickle-cell thalassemia beta plus with crisis, unspecified: Secondary | ICD-10-CM

## 2022-03-21 NOTE — Progress Notes (Unsigned)
[place] [place], [place] [address] Phone: [phone] Fax: [phone]   Office Visit Note  Patient Name: Billy Rodgers  Date of [id number]  Med Rec number [id number]  Date of Service: 03/24/2022  Allergies: Demerol [meperidine]  Chief Complaint  Patient presents with   Leg Pain     HPI 20 YO male with medical history significant for sickle cell disease and hypertension presents with concern of temperature change in bilateral lower legs.  Patient states he has been having pain last 2-3 days to R/L shoulders and elbows, low back, hips and sometimes knees. Somewhat unusual for him to have pain to multiple locations. Pain has not worsened or improved since it began.   Patient notes he has been falling asleep before taking Hydroxyurea for about a week. Thinks this could be contributing to pain. He is also overdue by 1 week to receive Adakveo infusion, supposed to receive monthly. Has appointment to receive infusion next week.  Called his hematologist at [facility] yesterday to discuss pain. Did not tell them he had been off Hydroxyurea or overdue for infusion.   His main concern today is that his legs have felt cold from knees down for about a week. He says they feel cool to touch. Denies pain from knees down. No decreased sensation or change to color of skin he has noticed. No swelling to lower extremities. Patient states he was recommended by hematologist to present to [facility] to make sure he has pulses in his feet.  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   Has been inconsistent taking Metoprolol, did not take today. Denies chest pain, headache, vision changes or shortness of breath.   Current Medication:  Outpatient Encounter Medications as of 03/21/2022  Medication Sig   ADAKVEO 100 MG/10ML SOLN Inject into the vein.   albuterol (VENTOLIN HFA) 108 (90 Base) MCG/ACT inhaler Inhale 2 puffs into the lungs every 6 (six)  hours as needed for wheezing or shortness of breath.   busPIRone (BUSPAR) 15 MG tablet Take 22.5 mg by mouth 2 (two) times daily.   cetirizine (ZYRTEC) 10 MG chewable tablet Chew by mouth.   Cholecalciferol (VITAMIN D-1000 MAX ST) 25 MCG (1000 UT) tablet Take by mouth.   Cholecalciferol 50 MCG (2000 UT) CAPS Take by mouth.   EPINEPHrine 0.3 mg/0.3 mL IJ SOAJ injection    escitalopram (LEXAPRO) 20 MG tablet Take 20 mg by mouth daily.   fluticasone (FLONASE) 50 MCG/ACT nasal spray Place 2 sprays into both nostrils daily.   gabapentin (NEURONTIN) 100 MG capsule Take by mouth.   hydroxyurea (HYDREA) 500 MG capsule Take 2,000 mg by mouth daily.   JARDIANCE 10 MG TABS tablet Take 10 mg by mouth daily.   lamoTRIgine (LAMICTAL) 25 MG tablet 1 BY MOUTH EVERY MORNING FOR 1 WEEK THEN 2 BY MOUTH EVERY MORNING   methylphenidate (RITALIN) 10 MG tablet TAKE 1 TABLET BY MOUTH EVERY DAY IN THE AFTERNOON   metoprolol tartrate (LOPRESSOR) 25 MG tablet Take 12.5 mg by mouth 2 (two) times daily.   montelukast (SINGULAIR) 10 MG tablet Take by mouth.   ondansetron (ZOFRAN-ODT) 4 MG disintegrating tablet Take by mouth.   sacubitril-valsartan (ENTRESTO) 24-26 MG Take 1 tablet by mouth 2 (two) times daily.   spironolactone (ALDACTONE) 25 MG tablet Take 1 tablet by mouth daily.   SYMBICORT 160-4.5 MCG/ACT inhaler Inhale into the lungs.   topiramate (TOPAMAX) 100 MG tablet Take by mouth.  Ubrogepant (UBRELVY) 100 MG TABS TAKE 1/2 TO 1 TABLET BY MOUTH AT ONSET OF HEADACHE. MAY REPEAT IN 2 HOURS IF NEEDED   [DISCONTINUED] fluticasone (FLONASE) 50 MCG/ACT nasal spray Place into the nose.   No facility-administered encounter medications on file as of 03/21/2022.      Medical History: Past Medical History:  Diagnosis Date   ADHD (attention deficit hyperactivity disorder)    Cardiomyopathy ([place])    CYP2C19 intermediate metabolizer (HCC)    G6PD deficiency    GAD (generalized anxiety disorder)    Hypertension     Migraine without aura and without status migrainosus, not intractable    Mild intermittent asthma in adult without complication    Obstructive sleep apnea    Sickle cell anemia (HCC)    Sickle cell disease, type S beta-plus thalassemia (HCC)      Vital Signs: BP (!) 152/92   Pulse 74   Temp 98.7 F (37.1 C) (Tympanic)   Wt 263 lb (119.3 kg)   SpO2 98%   BMI 36.68 kg/m    Review of Systems  Eyes:  Negative for visual disturbance.  Respiratory:  Negative for shortness of breath.   Cardiovascular:  Negative for chest pain and leg swelling.  Musculoskeletal:  Positive for arthralgias and back pain. Negative for joint swelling.  Skin:  Negative for color change and pallor.       Temperature change (legs feel cold)  Neurological:  Negative for dizziness, weakness, numbness and headaches.    Physical Exam Vitals reviewed.  Constitutional:      General: He is not in acute distress.    Appearance: He is obese. He is not ill-appearing or toxic-appearing.  Cardiovascular:     Rate and Rhythm: Normal rate and regular rhythm.     Pulses:          Dorsalis pedis pulses are 2+ on the right side and 2+ on the left side.       Posterior tibial pulses are 2+ on the right side and 2+ on the left side.     Heart sounds: No murmur heard.    No friction rub. No gallop.  Pulmonary:     Effort: Pulmonary effort is normal.     Breath sounds: Normal breath sounds. No wheezing, rhonchi or rales.  Musculoskeletal:     Cervical back: Neck supple. No rigidity.     Right lower leg: No edema.     Left lower leg: No edema.  Skin:    Comments: Skin to lower extremities warm, dry and with normal color. Right and left LE with symmetric color/temperature. Capillary refill <2 seconds in feet bilaterally. No rash, wound, discoloration or signs of injury noted to bilateral LE.  Neurological:     Mental Status: He is alert. Mental status is at baseline.     Gait: Gait is intact.     Comments: Sensation  to bilateral LE grossly intact/symmetric to light touch. Motor function grossly intact to LE bilaterally.     Assessment/Plan: 1. Sickle cell disease, type S beta-plus thalassemia with crisis ([place]) 2. Sensation of change in temperature Patient reassured there is evidence of good perfusion to both legs/feet. Has good pulses and capillary refill. Temperature and color of lower extremities normal on exam today.  -Discussed importance of adhering to medications to manage health conditions. In particular, advised to resume taking Hydroxyurea, as this may help manage/limit current pain episode. Keep appointment for infusion next week.  -Take blood pressure medicine as  prescribed to keep blood pressure controlled. -Drink plenty of water. -Manage pain episode as advised by hematologist.  -Contact hematologist again or go to emergency room with new or worsening symptoms (i.e. severe pain unresponsive to prescribed at-home treatment, chest pain, shortness of breath, numbness or color change to extremities) or if your current pain does not improve in the next 2-3 days.    General Counseling: hodari chuba understanding of the findings of todays visit and agrees with plan of treatment. I have discussed any further diagnostic evaluation that may be needed or ordered today. We also reviewed his medications today. he has been encouraged to call the office with any questions or concerns that should arise related to todays visit.   No orders of the defined types were placed in this encounter.   No orders of the defined types were placed in this encounter.   Time spent: [place] PA-C [place] 03/24/2022 12:00 AM

## 2022-03-23 ENCOUNTER — Encounter: Payer: Self-pay | Admitting: Medical

## 2022-03-24 ENCOUNTER — Encounter: Payer: Self-pay | Admitting: Medical

## 2022-03-24 NOTE — Patient Instructions (Addendum)
-  Your legs/feet show good blood flow today.  -Please take your medications as they are prescribed. In particular, resume taking Hydroxyurea, as this may help manage/limit your current pain episode. Keep the appointment for your infusion next week.  -Take blood pressure medicine as prescribed to keep blood pressure controlled. -Drink plenty of water. -Manage your pain episode as advised by your hematologist.  -Contact your hematologist again or go to the emergency room with new or worsening symptoms (i.e. severe pain unresponsive to prescribed at-home treatment, chest pain, shortness of breath, numbness or color change to extremities) or if your current pain does not improve in the next 2-3 days.

## 2022-04-14 ENCOUNTER — Ambulatory Visit (INDEPENDENT_AMBULATORY_CARE_PROVIDER_SITE_OTHER): Payer: BC Managed Care – PPO | Admitting: Adult Health

## 2022-04-14 ENCOUNTER — Encounter: Payer: Self-pay | Admitting: Adult Health

## 2022-04-14 VITALS — BP 130/88 | HR 111 | Temp 97.5°F

## 2022-04-14 DIAGNOSIS — R0981 Nasal congestion: Secondary | ICD-10-CM | POA: Diagnosis not present

## 2022-04-14 DIAGNOSIS — H938X3 Other specified disorders of ear, bilateral: Secondary | ICD-10-CM | POA: Diagnosis not present

## 2022-04-14 NOTE — Progress Notes (Signed)
[facility] [address] [place], [place] [address] Phone: [phone] Fax: [phone]   Office Visit Note  Patient Name: Billy Rodgers  Date of Birth:Jul 01, 2000  Med Rec number [id number]  Date of Service: 04/14/2022  Demerol [meperidine]  Chief Complaint  Patient presents with   Headache   Ear Pain    Both      Headache  Associated symptoms include ear pain and sinus pressure. Pertinent negatives include no coughing, eye pain or fever.    Patient reports he has been having "stuffy ears" for awhile, has been having right ear pain, and headache for a couple days. Denies sinus pressure, fever, chills cough or sore throat.  He has taken tylenol for the headache, with mild improvement.    Current Medication:  Outpatient Encounter Medications as of 04/14/2022  Medication Sig   ADAKVEO 100 MG/10ML SOLN Inject into the vein.   albuterol (VENTOLIN HFA) 108 (90 Base) MCG/ACT inhaler Inhale 2 puffs into the lungs every 6 (six) hours as needed for wheezing or shortness of breath.   busPIRone (BUSPAR) 15 MG tablet Take 22.5 mg by mouth 2 (two) times daily.   cetirizine (ZYRTEC) 10 MG chewable tablet Chew by mouth.   Cholecalciferol (VITAMIN D-1000 MAX ST) 25 MCG (1000 UT) tablet Take by mouth.   Cholecalciferol 50 MCG (2000 UT) CAPS Take by mouth.   EPINEPHrine 0.3 mg/0.3 mL IJ SOAJ injection    escitalopram (LEXAPRO) 20 MG tablet Take 20 mg by mouth daily.   fluticasone (FLONASE) 50 MCG/ACT nasal spray Place 2 sprays into both nostrils daily.   gabapentin (NEURONTIN) 100 MG capsule Take by mouth.   hydroxyurea (HYDREA) 500 MG capsule Take 2,000 mg by mouth daily.   JARDIANCE 10 MG TABS tablet Take 10 mg by mouth daily.   lamoTRIgine (LAMICTAL) 25 MG tablet 1 BY MOUTH EVERY MORNING FOR 1 WEEK THEN 2 BY MOUTH EVERY MORNING   methylphenidate (RITALIN) 10 MG tablet TAKE 1 TABLET BY MOUTH EVERY DAY IN THE AFTERNOON   metoprolol tartrate (LOPRESSOR) 25 MG tablet Take 12.5 mg  by mouth 2 (two) times daily.   montelukast (SINGULAIR) 10 MG tablet Take by mouth.   ondansetron (ZOFRAN-ODT) 4 MG disintegrating tablet Take by mouth.   sacubitril-valsartan (ENTRESTO) 24-26 MG Take 1 tablet by mouth 2 (two) times daily.   spironolactone (ALDACTONE) 25 MG tablet Take 1 tablet by mouth daily.   SYMBICORT 160-4.5 MCG/ACT inhaler Inhale into the lungs.   topiramate (TOPAMAX) 100 MG tablet Take by mouth.   Ubrogepant (UBRELVY) 100 MG TABS TAKE 1/2 TO 1 TABLET BY MOUTH AT ONSET OF HEADACHE. MAY REPEAT IN 2 HOURS IF NEEDED   No facility-administered encounter medications on file as of 04/14/2022.      Medical History: Past Medical History:  Diagnosis Date   ADHD (attention deficit hyperactivity disorder)    Cardiomyopathy (HCC)    CYP2C19 intermediate metabolizer (HCC)    G6PD deficiency    GAD (generalized anxiety disorder)    Hypertension    Migraine without aura and without status migrainosus, not intractable    Mild intermittent asthma in adult without complication    Obstructive sleep apnea    Sickle cell anemia (HCC)    Sickle cell disease, type S beta-plus thalassemia (HCC)      Vital Signs: BP 130/88   Pulse (!) 111   Temp (!) 97.5 F (36.4 C) (Tympanic)   SpO2 97%    Review of Systems  Constitutional:  Negative for chills, fatigue and fever.  HENT:  Positive for congestion, ear pain and sinus pressure. Negative for ear discharge.   Eyes:  Negative for pain and itching.  Respiratory:  Negative for cough.   Cardiovascular:  Negative for chest pain.  Gastrointestinal:  Negative for constipation, diarrhea and rectal pain.  Neurological:  Positive for headaches.    Physical Exam Vitals and nursing note reviewed.  Constitutional:      Appearance: He is well-developed.  HENT:     Right Ear: Decreased hearing noted. A middle ear effusion is present. There is no impacted cerumen. No mastoid tenderness. Tympanic membrane is not injected.     Left  Ear: Decreased hearing noted. A middle ear effusion is present. There is no impacted cerumen. No mastoid tenderness. Tympanic membrane is not injected.     Nose: Nose normal.     Mouth/Throat:     Mouth: Mucous membranes are moist.  Eyes:     Pupils: Pupils are equal, round, and reactive to light.  Pulmonary:     Effort: Pulmonary effort is normal.  Musculoskeletal:     Cervical back: No rigidity.  Lymphadenopathy:     Cervical: No cervical adenopathy.  Neurological:     Mental Status: He is alert.    Assessment/Plan: 1. Sinus congestion Use decongestants like Sudafed ( with high blood pressure, should avoid sudafed, and take Coricidin instead as discussed.) Stay hydrated and use flonase.  Follow up via MyChart messenger if symptoms fail to improve or may return to clinic as needed for worsening symptoms.    2. Ear pressure, bilateral Instructed patient to use Flonase, Two sprays in each nostril twice daily.  If symptoms worsen, or fail to improve, or new symptoms occur, follow up via mychart.      General Counseling: Billy Rodgers understanding of the findings of todays visit and agrees with plan of treatment. I have discussed any further diagnostic evaluation that may be needed or ordered today. We also reviewed his medications today. he has been encouraged to call the office with any questions or concerns that should arise related to todays visit.   No orders of the defined types were placed in this encounter.   No orders of the defined types were placed in this encounter.   Time spent:20 Minutes Time spent includes review of chart, medications, test results, and follow up plan with the patient.    Kendell Bane AGNP-C Nurse Practitioner
